# Patient Record
Sex: Female | Born: 1950
Health system: Southern US, Community
[De-identification: ages and names within clinical notes are randomized; demographics above are authoritative.]

## PROBLEM LIST (undated history)

## (undated) DIAGNOSIS — R112 Nausea with vomiting, unspecified: Secondary | ICD-10-CM

## (undated) DIAGNOSIS — I10 Essential (primary) hypertension: Secondary | ICD-10-CM

## (undated) DIAGNOSIS — E042 Nontoxic multinodular goiter: Secondary | ICD-10-CM

## (undated) DIAGNOSIS — E7800 Pure hypercholesterolemia, unspecified: Secondary | ICD-10-CM

## (undated) DIAGNOSIS — T8859XA Other complications of anesthesia, initial encounter: Secondary | ICD-10-CM

## (undated) DIAGNOSIS — M1990 Unspecified osteoarthritis, unspecified site: Secondary | ICD-10-CM

## (undated) HISTORY — PX: EYE SURGERY: SHX253

## (undated) HISTORY — PX: TONSILLECTOMY: SUR1361

## (undated) HISTORY — PX: ABDOMINAL HYSTERECTOMY: SHX81

---

## 2001-09-30 ENCOUNTER — Emergency Department (HOSPITAL_COMMUNITY): Admission: EM | Admit: 2001-09-30 | Discharge: 2001-09-30 | Payer: Self-pay | Admitting: Emergency Medicine

## 2001-09-30 ENCOUNTER — Encounter: Payer: Self-pay | Admitting: Emergency Medicine

## 2002-03-04 ENCOUNTER — Ambulatory Visit (HOSPITAL_COMMUNITY): Admission: EM | Admit: 2002-03-04 | Discharge: 2002-03-04 | Payer: Self-pay | Admitting: Emergency Medicine

## 2002-03-04 ENCOUNTER — Encounter: Payer: Self-pay | Admitting: Emergency Medicine

## 2002-03-21 HISTORY — PX: ABDOMINAL SURGERY: SHX537

## 2002-12-24 ENCOUNTER — Encounter: Payer: Self-pay | Admitting: Emergency Medicine

## 2002-12-24 ENCOUNTER — Inpatient Hospital Stay (HOSPITAL_COMMUNITY): Admission: EM | Admit: 2002-12-24 | Discharge: 2002-12-31 | Payer: Self-pay | Admitting: Emergency Medicine

## 2002-12-25 ENCOUNTER — Encounter: Payer: Self-pay | Admitting: Internal Medicine

## 2002-12-26 ENCOUNTER — Encounter: Payer: Self-pay | Admitting: General Surgery

## 2002-12-26 ENCOUNTER — Encounter: Payer: Self-pay | Admitting: Surgery

## 2002-12-26 ENCOUNTER — Encounter (INDEPENDENT_AMBULATORY_CARE_PROVIDER_SITE_OTHER): Payer: Self-pay | Admitting: *Deleted

## 2003-08-15 ENCOUNTER — Ambulatory Visit (HOSPITAL_COMMUNITY): Admission: RE | Admit: 2003-08-15 | Discharge: 2003-08-15 | Payer: Self-pay | Admitting: General Surgery

## 2004-08-23 ENCOUNTER — Ambulatory Visit (HOSPITAL_COMMUNITY): Admission: RE | Admit: 2004-08-23 | Discharge: 2004-08-23 | Payer: Self-pay | Admitting: General Surgery

## 2005-05-26 ENCOUNTER — Ambulatory Visit: Payer: Self-pay | Admitting: Family Medicine

## 2005-05-31 ENCOUNTER — Ambulatory Visit (HOSPITAL_COMMUNITY): Admission: RE | Admit: 2005-05-31 | Discharge: 2005-05-31 | Payer: Self-pay | Admitting: Family Medicine

## 2005-06-10 ENCOUNTER — Ambulatory Visit: Payer: Self-pay | Admitting: Family Medicine

## 2005-10-18 ENCOUNTER — Ambulatory Visit (HOSPITAL_COMMUNITY): Admission: RE | Admit: 2005-10-18 | Discharge: 2005-10-18 | Payer: Self-pay | Admitting: General Surgery

## 2005-11-07 ENCOUNTER — Emergency Department (HOSPITAL_COMMUNITY): Admission: EM | Admit: 2005-11-07 | Discharge: 2005-11-07 | Payer: Self-pay | Admitting: Emergency Medicine

## 2008-03-17 ENCOUNTER — Emergency Department (HOSPITAL_COMMUNITY): Admission: EM | Admit: 2008-03-17 | Discharge: 2008-03-17 | Payer: Self-pay | Admitting: Family Medicine

## 2008-08-06 ENCOUNTER — Ambulatory Visit: Payer: Self-pay | Admitting: Family Medicine

## 2008-08-06 DIAGNOSIS — IMO0002 Reserved for concepts with insufficient information to code with codable children: Secondary | ICD-10-CM

## 2008-08-06 DIAGNOSIS — F329 Major depressive disorder, single episode, unspecified: Secondary | ICD-10-CM

## 2010-08-06 NOTE — Discharge Summary (Signed)
NAMEJANICIA, [name] NO.:  [id number]   MEDICAL RECORD NO.:  [id number]                   PATIENT TYPE:  INP   LOCATION:  5738                                 FACILITY:  [facility]   PHYSICIAN:  [name], M.D.            DATE OF BIRTH:  06-06-1950   DATE OF ADMISSION:  12/24/2002  DATE OF DISCHARGE:  12/31/2002                                 DISCHARGE SUMMARY   ADMISSION DIAGNOSIS:  Perforated diverticulitis.   DISCHARGE DIAGNOSES:  1. Perforated Merckle's diverticulum with peritonitis.  2. Hypokalemia.  3. Atelectasis.   CHIEF COMPLAINT:  Abdominal pain.   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  Evidentially, it was much worse at a meeting today, and she was doubled over  in pain.  She was febrile with a T-max of 101.6.  Was seen in the emergency  room where a CT scan of the abdomen showed perforated sigmoid diverticulum.  She had been on amoxicillin for one day for some dental work and thought  that she was having a rash earlier.   ALLERGIES:  MORPHINE.   MEDICATIONS ON ADMISSION:  Calcium and Pepcid.   PAST MEDICAL HISTORY:  1. Menopause.  2. GERD.  3. Seasonal allergies.  4. History of viral meningitis.   PAST SURGICAL HISTORY:  1. TAH.  2. Appendectomy in 1990.   SOCIAL HISTORY:  Single with two children.  No tobacco and occasional  alcohol.  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   FAMILY HISTORY:  Noncontributory.   REVIEW OF SYSTEMS:  No history of diverticulitis.   PHYSICAL EXAMINATION:  GENERAL:  She had just received pain medicines prior  to her examination and was a little drowsy.  VITAL SIGNS:  T-max 101.6, current temperature on examination 101.  Blood  pressure 128/74, pulse 90 to 100s.  Pulse oximetry 97%.  HEENT:   Pupils were reactive bilaterally with dry mouth.  Her face was  flushed.  NECK:  Supple.  CHEST:  Clear to auscultation bilaterally.  HEART:  Regular with normal S1 and S2 and no bruits.  ABDOMEN:  Round with slight distention and positive bowel sounds.  Soft and  tender in the lower abdomen, left greater than right.  No rebound or  guarding.  RECTAL:  Exam was deferred.  EXTREMITIES:  Skin was warm and dry with no edema of the extremities and 2+  dorsalis pedis pulses.  NEUROLOGIC:  Alert and oriented when stimulated but still a little bit  drowsy.  Behavior was appropriate.  No focal deficits observed.   LABORATORY DATA:  Significant data: Hemoglobin 13.1, white blood cells 10.1.  Basic metabolic profile was completely normal with normal LFTs.  Urinalysis  is okay.  Initial report of the CT scan showed sigmoid diverticulum in the abdomen  with contained perforation and extraluminal air.  (This was small, no  abscess.)   HOSPITAL COURSE:  [name] was admitted with the initial diagnosis of sigmoid  diverticulitis with contained perforation and free air.  She continued to  have fevers with abdominal distention.  Her potassium level initially  dropped on antibiotics.  She was treated with intravenous Zosyn and  ciprofloxacin.  Surgical consult was obtained.  Repeat CT scan of the  abdomen showed slight worsening of free air and inflammation within the  central pelvic mesentery with several thickened loops of small bowel  contiguous with the inflammatory changes.  There was question of whether she  had more of a perforated Merckle's diverticulum as opposed to sigmoid  diverticulitis.  [name] discussed surgery with [name], especially since  she seemed to have more localized symptoms, and the CT scan was worsening.  She was taken to the OR on December 26, 2002, for resection of the Meckel's  diverticulum.   She did well after surgery and defervesced appropriately.  Abdominal exam   became much better.  She was able to get out of bed to chair and ambulate.  There was a question whether or not she had atelectasis during  hospitalization with low oxygen saturation, but continued use of incentive  spirometry and ambulation corrected this.  She was afebrile at the time of  discharge, mobile, with clean dressings as well.   CONDITION ON DISCHARGE:  Stable and improved.   DISCHARGE MEDICATIONS:  1. Ciprofloxacin 500 mg daily x 3 days.  2. Flagyl 500 mg b.i.d. x 3 days.  3. Restart calcium.  4. Percocet given for pain.   DIET:  Regular.   DISCHARGE INSTRUCTIONS:  She was to go home with wound care by [facility] on the day following admission.  She was to call for increased pain,  fevers, chills, etc.  She was to follow up with [name] within one week of  discharge.   CONSULTATIONS:  Surgery, [name], M.D.   ADDENDUM:  She also had received Flagyl during the admission.                                                [name], M.D.    SEJ/MEDQ  D:  02/15/2003  T:  02/15/2003  Job:  [id number]   cc:   [name], M.D.  [address], [address]  [place]  [place] [address]  Fax: [phone]

## 2010-08-06 NOTE — Discharge Summary (Signed)
NAMEAVORY, RAHIMI NO.:  [id number]   MEDICAL RECORD NO.:  [id number]                   PATIENT TYPE:  INP   LOCATION:  5738                                 FACILITY:  [facility]   PHYSICIAN:  Jimmye Norman III, M.D.               DATE OF BIRTH:  1951-02-26   DATE OF ADMISSION:  12/24/2002  DATE OF DISCHARGE:  12/31/2002                                 DISCHARGE SUMMARY   DISCHARGE DIAGNOSES:  Ruptured Meckel's diverticulum.   PRINCIPAL PROCEDURE:  Exploration, resection, repair of ruptured Meckel's  diverticulum.   SURGEON:  Jimmye Norman, M.D.   DISCHARGE MEDICATIONS:  Patient is sent home with Percocet for pain.  Patient is also sent home on Cipro and Flagyl which was to be continued for  another three days.   WOUND CARE:  Patient has [facility] for wound care.   DIET:  Patient is on a nonrestricted diet.   FOLLOW UP:  Patient is to see Dr. Lindie Spruce in one week.   HOSPITAL COURSE:  Briefly, the patient was admitted by the medicine service  on December 24, 2002 where surgical consultation was obtained.  Initial CT  scan demonstrated what was thought to be sigmoid diverticulitis and attempt  was made to calm her down with intravenous antibiotics.  Her pain became  worse over the next two days and on the third day of her hospitalization she  was taken to surgery at which time she was found to have a perforated  Meckel's diverticulum.  This was after a repeat CT scan was more suggestive  of a small bowel or Meckel's perforation.  She did well, although we did  have to leave her wound open because of the contamination from the  perforated viscus.  On postoperative day #4 she was tolerating a diet well,  had bowel sounds.  On postoperative day #5 we advanced her diet. [facility] was to see the patient and I was able to let he go home.  She is  to return to see me in one week.                                                Kathrin Ruddy, M.D.    JW/MEDQ  D:  03/06/2003  T:  03/06/2003  Job:  [id number]

## 2010-08-06 NOTE — Op Note (Signed)
NAMERUE, VALLADARES NO.:  [id number]   MEDICAL RECORD NO.:  [id number]                   PATIENT TYPE:  INP   LOCATION:  5738                                 FACILITY:  [facility]   PHYSICIAN:  Jimmye Norman III, M.D.               DATE OF BIRTH:  1950-04-04   DATE OF PROCEDURE:  12/26/2002  DATE OF DISCHARGE:                                 OPERATIVE REPORT   PREOPERATIVE DIAGNOSES:  Perforated viscus, likely perforated sigmoid  diverticulum, with diverticulitis and localized abscess.   POSTOPERATIVE DIAGNOSES:  Perforated Meckel's diverticulum with localized  intramesenteric abscess and peritonitis.   PROCEDURE:  Resection of Meckel's diverticulum.   SURGEON:  Jimmye Norman, M.D.   ASSISTANT:  Velora Heckler, M.D.   ANESTHESIA:  General endotracheal.   ESTIMATED BLOOD LOSS:  50 mL.   COMPLICATIONS:  None.   CONDITION:  Stable.   SPECIMENS:  Perforated Meckel's diverticulum.   FLUIDS REPLACED:  2 L of crystalloid.   INDICATION FOR OPERATION:  The patient is a 60 year old who had the acute  onset of abdominal pain two days ago with CT scan demonstrating a pelvic  inflammatory mass or phlegmon with some perforated or free air in the  localized mesenteric area, who now on recent scan was found to have a likely  perforated small bowel and a possible Meckel's diverticulum.   OPERATION:  The patient was taken to the operating room and placed on the  table in supine position.  After an adequate endotracheal anesthetic was  administered, she was prepped and draped in the usual sterile manner  exposing the midline.   A midline incision was taken from just to the right of the umbilicus down  to, through the midline, to the suprapubic crest.  It was taken down through  the midline fascia using electrocautery.  We were able to get into the  peritoneal cavity using Metzenbaum scissors and once we did, we opened the  incision inferiorly and superiorly  for the full length of the skin incision.   Once we were in the peritoneal cavity, cloudy peritoneal fluid was found in  the pelvic portion of the peritoneal cavity.  There was an inflammatory mass  in the deep portion of the pelvis attached posteriorly and inferiorly.  It  was attached to multiple appendices epiploica, and once we were able to free  them we showed that there was a perforated diverticulum of the Meckel's type  of small bowel.  This was approximately 20 cm from the ileocecal valve.   This was a localized perforation.  There was no evidence of any  subdiaphragmatic or other process.   We resected the Meckel's diverticulum up to viable bowel using a GIA-55  stapler and did the anastomosis using the same type of stapler.  The  resulting enterotomy was closed using a TA-55 stapler.  We then closed  the  mesentery using figure-of-eight stitches of 3-0 silk.  We secured the suture  line also with a 3-0 silk suture.  Once this was done we ran the small bowel  from the ligament of Treitz down to the terminal ileum, found there to be no  other areas of perforation; however, there were some loculated pockets of  pus from the perforation.  We irrigated with about 4 L of warm saline  solution.  The subdiaphragmatic areas were free of fluid collections.  This  was on both sides.  The colon from the cecum to the transverse colon and the  descending colon were free of any inflammatory process, and no palpable  masses.  The gallbladder was somewhat tense but had no evidence of stones.   Once we had completed the exploration and the resection, we closed the  abdomen.  The fascia was closed using a running #1 PDS suture.  Once this  was done the skin and subcu was irrigated with saline solution and then it  was partially closed with stainless steel staples, packing in between the  staples with a wet-to-dry 4 x 4 gauze.  A sterile dressing was applied on  top of that.  All needle counts,  sponge counts, and instrument counts were  correct.                                               Kathrin Ruddy, M.D.    JW/MEDQ  D:  12/26/2002  T:  12/27/2002  Job:  [id number]   cc:   Lilla Shook, M.D.  [address], [address]  [place]  [place]  [address]  Fax: [phone]

## 2010-12-23 LAB — POCT CARDIAC MARKERS
CKMB, poc: 1.2 ng/mL (ref 1.0–8.0)
Myoglobin, poc: 99.4 ng/mL (ref 12–200)
Troponin i, poc: <0.05 ng/mL (ref 0.00–0.09)

## 2010-12-23 LAB — POCT I-STAT, CHEM 8
BUN: 10 mg/dL (ref 6–23)
Calcium, Ion: 1.16 mmol/L (ref 1.12–1.32)
Chloride: 107 mEq/L (ref 96–112)
Creatinine, Ser: 1.1 mg/dL (ref 0.4–1.2)
HCT: 42 % (ref 36.0–46.0)
Hemoglobin: 14.3 g/dL (ref 12.0–15.0)
Sodium: 141 mEq/L (ref 135–145)

## 2011-04-19 ENCOUNTER — Other Ambulatory Visit: Payer: Self-pay | Admitting: Family Medicine

## 2018-07-17 ENCOUNTER — Encounter: Payer: Self-pay | Admitting: *Deleted

## 2018-08-01 DIAGNOSIS — Z8659 Personal history of other mental and behavioral disorders: Secondary | ICD-10-CM | POA: Diagnosis not present

## 2018-08-01 DIAGNOSIS — Z0000 Encounter for general adult medical examination without abnormal findings: Secondary | ICD-10-CM | POA: Diagnosis not present

## 2018-08-01 DIAGNOSIS — R030 Elevated blood-pressure reading, without diagnosis of hypertension: Secondary | ICD-10-CM | POA: Diagnosis not present

## 2018-08-09 ENCOUNTER — Other Ambulatory Visit: Payer: Self-pay | Admitting: *Deleted

## 2018-08-09 NOTE — Patient Outreach (Signed)
[facility]) Care Management  08/09/2018  KILEIGH ORTMANN 1950/09/01 [id number]   HTA HRA Telephone Screen     Outreach Attempt:  Multiple unsuccessful outreach attempts to patient in the month of April to complete Health Risk Assessment Screening.  HIPAA compliant voice messages left.   Plan:   RN Health Coach has sent Unsuccessful Letter.  RN Health Coach will make another outreach attempt to patient within the month of June.  [place] [phone] [email or web address] [email or web address]

## 2018-08-16 ENCOUNTER — Other Ambulatory Visit: Payer: Self-pay | Admitting: *Deleted

## 2018-08-22 DIAGNOSIS — F325 Major depressive disorder, single episode, in full remission: Secondary | ICD-10-CM | POA: Diagnosis not present

## 2018-08-22 DIAGNOSIS — I129 Hypertensive chronic kidney disease with stage 1 through stage 4 chronic kidney disease, or unspecified chronic kidney disease: Secondary | ICD-10-CM | POA: Diagnosis not present

## 2018-08-22 DIAGNOSIS — N183 Chronic kidney disease, stage 3 (moderate): Secondary | ICD-10-CM | POA: Diagnosis not present

## 2018-08-27 DIAGNOSIS — I129 Hypertensive chronic kidney disease with stage 1 through stage 4 chronic kidney disease, or unspecified chronic kidney disease: Secondary | ICD-10-CM | POA: Diagnosis not present

## 2018-08-31 DIAGNOSIS — I129 Hypertensive chronic kidney disease with stage 1 through stage 4 chronic kidney disease, or unspecified chronic kidney disease: Secondary | ICD-10-CM | POA: Diagnosis not present

## 2018-09-11 ENCOUNTER — Other Ambulatory Visit: Payer: Self-pay | Admitting: *Deleted

## 2018-09-11 NOTE — Patient Outreach (Addendum)
[place] [facility]) Care Management  09/11/2018  [name] [birth year] [id number]   Case Closure/Transition to [facility]   Outreach:  Patient case has been transitioned to [facility] for further Care Management assistance.  Plan: RN Health Coach will close case at this time. RN Health Coach unable to send primary care provider Care Management Case Closure Letter due to no PCP listed in the system. RN Health Coach will make patient inactive with [facility] Care Management at this time.  [place] [phone] [email or web address]

## 2018-09-19 DIAGNOSIS — N183 Chronic kidney disease, stage 3 (moderate): Secondary | ICD-10-CM | POA: Diagnosis not present

## 2018-09-19 DIAGNOSIS — I129 Hypertensive chronic kidney disease with stage 1 through stage 4 chronic kidney disease, or unspecified chronic kidney disease: Secondary | ICD-10-CM | POA: Diagnosis not present

## 2018-09-19 DIAGNOSIS — F325 Major depressive disorder, single episode, in full remission: Secondary | ICD-10-CM | POA: Diagnosis not present

## 2018-09-25 DIAGNOSIS — D1721 Benign lipomatous neoplasm of skin and subcutaneous tissue of right arm: Secondary | ICD-10-CM | POA: Diagnosis not present

## 2018-09-25 DIAGNOSIS — L570 Actinic keratosis: Secondary | ICD-10-CM | POA: Diagnosis not present

## 2018-09-25 DIAGNOSIS — L820 Inflamed seborrheic keratosis: Secondary | ICD-10-CM | POA: Diagnosis not present

## 2019-03-11 DIAGNOSIS — F325 Major depressive disorder, single episode, in full remission: Secondary | ICD-10-CM | POA: Diagnosis not present

## 2019-03-11 DIAGNOSIS — I129 Hypertensive chronic kidney disease with stage 1 through stage 4 chronic kidney disease, or unspecified chronic kidney disease: Secondary | ICD-10-CM | POA: Diagnosis not present

## 2019-03-11 DIAGNOSIS — R519 Headache, unspecified: Secondary | ICD-10-CM | POA: Diagnosis not present

## 2019-03-11 DIAGNOSIS — E7800 Pure hypercholesterolemia, unspecified: Secondary | ICD-10-CM | POA: Diagnosis not present

## 2019-03-11 DIAGNOSIS — N183 Chronic kidney disease, stage 3 unspecified: Secondary | ICD-10-CM | POA: Diagnosis not present

## 2019-04-29 ENCOUNTER — Ambulatory Visit: Payer: PPO

## 2019-05-02 DIAGNOSIS — I129 Hypertensive chronic kidney disease with stage 1 through stage 4 chronic kidney disease, or unspecified chronic kidney disease: Secondary | ICD-10-CM | POA: Diagnosis not present

## 2019-05-02 DIAGNOSIS — I6523 Occlusion and stenosis of bilateral carotid arteries: Secondary | ICD-10-CM | POA: Diagnosis not present

## 2019-05-20 ENCOUNTER — Other Ambulatory Visit: Payer: Self-pay

## 2019-05-20 ENCOUNTER — Emergency Department
Admission: EM | Admit: 2019-05-20 | Discharge: 2019-05-20 | Disposition: A | Discharge status: Home / Self Care | Payer: PPO | Attending: Emergency Medicine | Admitting: Emergency Medicine

## 2019-05-20 ENCOUNTER — Encounter: Payer: Self-pay | Admitting: Emergency Medicine

## 2019-05-20 ENCOUNTER — Emergency Department: Payer: PPO

## 2019-05-20 DIAGNOSIS — Y929 Unspecified place or not applicable: Secondary | ICD-10-CM | POA: Diagnosis not present

## 2019-05-20 DIAGNOSIS — W0110XA Fall on same level from slipping, tripping and stumbling with subsequent striking against unspecified object, initial encounter: Secondary | ICD-10-CM | POA: Diagnosis not present

## 2019-05-20 DIAGNOSIS — I10 Essential (primary) hypertension: Secondary | ICD-10-CM | POA: Diagnosis not present

## 2019-05-20 DIAGNOSIS — Y939 Activity, unspecified: Secondary | ICD-10-CM | POA: Insufficient documentation

## 2019-05-20 DIAGNOSIS — S0990XA Unspecified injury of head, initial encounter: Secondary | ICD-10-CM | POA: Insufficient documentation

## 2019-05-20 DIAGNOSIS — Z79899 Other long term (current) drug therapy: Secondary | ICD-10-CM | POA: Insufficient documentation

## 2019-05-20 DIAGNOSIS — R55 Syncope and collapse: Secondary | ICD-10-CM | POA: Diagnosis not present

## 2019-05-20 DIAGNOSIS — Y999 Unspecified external cause status: Secondary | ICD-10-CM | POA: Insufficient documentation

## 2019-05-20 HISTORY — DX: Pure hypercholesterolemia, unspecified: E78.00

## 2019-05-20 HISTORY — DX: Essential (primary) hypertension: I10

## 2019-05-20 LAB — BASIC METABOLIC PANEL
Anion gap: 10 (ref 5–15)
BUN: 12 mg/dL (ref 8–23)
CO2: 25 mmol/L (ref 22–32)
Calcium: 8.9 mg/dL (ref 8.9–10.3)
Chloride: 103 mmol/L (ref 98–111)
Creatinine, Ser: 1.26 mg/dL — ABNORMAL HIGH (ref 0.44–1.00)
GFR calc Af Amer: 50 mL/min — ABNORMAL LOW (ref >60)
GFR calc non Af Amer: 43 mL/min — ABNORMAL LOW (ref >60)
Glucose, Bld: 145 mg/dL — ABNORMAL HIGH (ref 70–99)
Potassium: 4.3 mmol/L (ref 3.5–5.1)
Sodium: 138 mmol/L (ref 135–145)

## 2019-05-20 LAB — URINALYSIS, COMPLETE (UACMP) WITH MICROSCOPIC
Bilirubin Urine: NEGATIVE
Glucose, UA: NEGATIVE mg/dL
Hgb urine dipstick: NEGATIVE
Ketones, ur: NEGATIVE mg/dL
Leukocytes,Ua: NEGATIVE
Nitrite: NEGATIVE
Protein, ur: NEGATIVE mg/dL
Specific Gravity, Urine: 1.006 (ref 1.005–1.030)
pH: 6 (ref 5.0–8.0)

## 2019-05-20 LAB — CBC
HCT: 42.6 % (ref 36.0–46.0)
Hemoglobin: 14 g/dL (ref 12.0–15.0)
MCH: 29.7 pg (ref 26.0–34.0)
MCHC: 32.9 g/dL (ref 30.0–36.0)
MCV: 90.3 fL (ref 80.0–100.0)
Platelets: 220 10*3/uL (ref 150–400)
RBC: 4.72 MIL/uL (ref 3.87–5.11)
RDW: 12.1 % (ref 11.5–15.5)
WBC: 5.6 10*3/uL (ref 4.0–10.5)
nRBC: 0 % (ref 0.0–0.2)

## 2019-05-20 MED ORDER — SODIUM CHLORIDE 0.9% FLUSH: 3.0000 mL | Freq: Once | INTRAVENOUS | Status: DC

## 2019-05-20 NOTE — ED Provider Notes (Signed)
[facility] Emergency Department Provider Note ____________________________________________   First MD Initiated Contact with Patient 05/20/19 1541     (approximate)  I have reviewed the triage vital signs and the nursing notes.   HISTORY  Chief Complaint Loss of Consciousness    HPI [name] is a 69 y.o. female with PMH as noted below who presents with a head injury and syncope acute onset this morning.  The patient states that she had her second COVID-19 vaccination 2 days ago, and yesterday was feeling weak and ill with chills and was not eating or drinking.  The patient states that today after she got up this morning she felt lightheaded, lost consciousness, and then found herself on the floor.  She sustained a laceration of the back of her head which initially was bleeding a lot but has now stopped.  She denies any other injuries.  She states that she now feels completely back to her baseline.  Past Medical History:  Diagnosis Date  . High cholesterol   . Hypertension     Patient Active Problem List   Diagnosis Date Noted  . DEPRESSION 08/06/2008  . BACK PAIN, LUMBAR, WITH RADICULOPATHY 08/06/2008    Past Surgical History:  Procedure Laterality Date  . ABDOMINAL HYSTERECTOMY    . ABDOMINAL SURGERY      Prior to Admission medications   Medication Sig Start Date End Date Taking? Authorizing Provider  Calcium Citrate-Vitamin D 500-400 MG-UNIT CHEW One by mouth twice daily.     [provider]  cyclobenzaprine (FLEXERIL) 5 MG tablet Take one or two by mouth 3 times a day as needed for back spasm.     [provider]  FLUoxetine (PROZAC) 10 MG capsule Take 10 mg by mouth every morning.      [provider]  predniSONE (DELTASONE) 20 MG tablet Take 20 mg by mouth 2 (two) times daily. For 5 days.     [provider]    Allergies Morphine  No family history on file.  Social History Social History    Tobacco Use  . Smoking status: Never Smoker  . Smokeless tobacco: Never Used  Substance Use Topics  . Alcohol use: Not on file  . Drug use: Not on file    Review of Systems  Constitutional: No fever. Eyes: No redness. ENT: No sore throat. Cardiovascular: Denies chest pain. Respiratory: Denies shortness of breath. Gastrointestinal: No vomiting or diarrhea.  Genitourinary: Negative for flank pain.  Musculoskeletal: Negative for back pain. Skin: Negative for rash. Neurological: Negative for headaches, focal weakness or numbness.   ____________________________________________   PHYSICAL EXAM:  VITAL SIGNS: ED Triage Vitals  Enc Vitals Group     BP 05/20/19 1337 (!) 158/80     Pulse Rate 05/20/19 1337 91     Resp 05/20/19 1337 16     Temp 05/20/19 1337 98.3 F (36.8 C)     Temp Source 05/20/19 1337 Oral     SpO2 05/20/19 1337 98 %     Weight 05/20/19 1338 178 lb (80.7 kg)     Height 05/20/19 1338 5\' 6"  (1.676 m)     Head Circumference --      Peak Flow --      Pain Score 05/20/19 1338 2     Pain Loc --      Pain Edu? --      Excl. in [place]? --     Constitutional: Alert and oriented. Well appearing and  in no acute distress. Eyes: Conjunctivae are normal.  EOMI.  PERRLA. Head: 1.5 cm superficial laceration to the left occipital area which appears closed.  No bleeding. Nose: No congestion/rhinnorhea. Mouth/Throat: Mucous membranes are moist.   Neck: Normal range of motion.  No midline cervical spinal tenderness. Cardiovascular: Good peripheral circulation. Respiratory: Normal respiratory effort.  No retractions. Gastrointestinal: No distention.  Musculoskeletal:  Extremities warm and well perfused.  Neurologic:  Normal speech and language.  Motor and sensory intact in all extremities.  Normal coordination.   Skin:  Skin is warm and dry. No rash noted. Psychiatric: Mood and affect are normal. Speech and behavior are  normal.  ____________________________________________   LABS (all labs ordered are listed, but only abnormal results are displayed)  Labs Reviewed  BASIC METABOLIC PANEL - Abnormal; Notable for the following components:      Result Value   Glucose, Bld 145 (*)    Creatinine, Ser 1.26 (*)    GFR calc non Af Amer 43 (*)    GFR calc Af Amer 50 (*)    All other components within normal limits  URINALYSIS, COMPLETE (UACMP) WITH MICROSCOPIC - Abnormal; Notable for the following components:   Color, Urine YELLOW (*)    APPearance CLEAR (*)    Bacteria, UA RARE (*)    All other components within normal limits  CBC  CBG MONITORING, ED   ____________________________________________  EKG  ED ECG REPORT I, Arta Silence, the attending physician, personally viewed and interpreted this ECG.  Date: 05/20/2019 EKG Time: 1339 Rate: 85 Rhythm: normal sinus rhythm QRS Axis: normal Intervals: normal ST/T Wave abnormalities: normal Narrative Interpretation: no evidence of acute ischemia  ____________________________________________  RADIOLOGY  CT head: No ICH or other acute abnormality  ____________________________________________   PROCEDURES  Procedure(s) performed: No  Procedures  Critical Care performed: No ____________________________________________   INITIAL IMPRESSION / ASSESSMENT AND PLAN / ED COURSE  Pertinent labs & imaging results that were available during my care of the patient were reviewed by me and considered in my medical decision making (see chart for details).  69 year old female with PMH as noted above presents with syncope and a head injury earlier today.  The patient states that she felt weak with chills and fever yesterday after getting her second dose of the COVID-19 vaccination the day before.  She had not really been eating or drinking much.  Today after getting up she felt dizzy and then lost consciousness.  On exam, the patient is  well-appearing and her vital signs are normal except for mild hypertension.  Neurologic exam is nonfocal.  She has a small laceration to the back of her head which appears to have already closed on its own and does not require repair.  There is no midline spinal tenderness.  Lab work-up was obtained from triage and is within normal limits except for minimally elevated creatinine from baseline.  The patient is currently completely asymptomatic.  Overall I suspect vasovagal or hypovolemia/orthostatic related syncope probably from being dehydrated from not eating or drinking the day prior.  Given the reassuring lab work-up and vital signs, there is no indication for IV fluids at this time.  There are no signs or symptoms to suggest cardiac etiology, the patient's EKG is normal.  Because of the patient's age and the fact that she is on aspirin, I recommended a CT of the head.  If this is negative anticipate discharge home.  ----------------------------------------- 5:06 PM on 05/20/2019 -----------------------------------------  CT head is  negative.  The patient is stable for discharge home.  Return precautions given, and she expresses understanding.  ____________________________________________   FINAL CLINICAL IMPRESSION(S) / ED DIAGNOSES  Final diagnoses:  Syncope, unspecified syncope type      NEW MEDICATIONS STARTED DURING THIS VISIT:  New Prescriptions   No medications on file     Note:  This document was prepared using Dragon voice recognition software and may include unintentional dictation errors.   Arta Silence, MD 05/20/19 [phone]

## 2019-05-20 NOTE — Discharge Instructions (Addendum)
Return to the ER for new, worsening, or recurrent lightheadedness or feeling like you are going to pass out, recurrent episodes of passing out, severe headache, vomiting, weakness or any other new or worsening symptoms that concern you.  Make sure you are drinking fluids regularly.  Follow-up with your primary care doctor.

## 2019-05-20 NOTE — ED Notes (Signed)
First Nurse Note:  Pt brought over from [facility]; brought over due to syncopal episode this morning.  Pt also with laceration to posterior head, bleeding controlled at this time.  Pt A/Ox4 upon arrival, ambulatory without difficulty.  NAD noted at this time.

## 2019-05-20 NOTE — ED Notes (Signed)
Pt given paper copy of discharge instructions to sign due to computer issue.

## 2019-05-20 NOTE — ED Triage Notes (Signed)
Had syncopal event this morning, fell and hit head.  States had second covid shot on Saturday and felt bad yesterday.  States had run a fever and did not eat or drink much yesterday.  Arrives today, AAOx3.  Skin warm and dry. NAD.  Small laceration to back of head.  Bleeding controlled.

## 2019-06-10 DIAGNOSIS — F325 Major depressive disorder, single episode, in full remission: Secondary | ICD-10-CM | POA: Diagnosis not present

## 2019-06-10 DIAGNOSIS — I129 Hypertensive chronic kidney disease with stage 1 through stage 4 chronic kidney disease, or unspecified chronic kidney disease: Secondary | ICD-10-CM | POA: Diagnosis not present

## 2019-06-10 DIAGNOSIS — I779 Disorder of arteries and arterioles, unspecified: Secondary | ICD-10-CM | POA: Diagnosis not present

## 2019-06-10 DIAGNOSIS — N183 Chronic kidney disease, stage 3 unspecified: Secondary | ICD-10-CM | POA: Diagnosis not present

## 2019-06-14 DIAGNOSIS — E041 Nontoxic single thyroid nodule: Secondary | ICD-10-CM | POA: Diagnosis not present

## 2019-07-31 DIAGNOSIS — E042 Nontoxic multinodular goiter: Secondary | ICD-10-CM | POA: Diagnosis not present

## 2019-08-21 DIAGNOSIS — E042 Nontoxic multinodular goiter: Secondary | ICD-10-CM | POA: Diagnosis not present

## 2019-10-08 DIAGNOSIS — I10 Essential (primary) hypertension: Secondary | ICD-10-CM | POA: Diagnosis not present

## 2019-10-08 DIAGNOSIS — Z885 Allergy status to narcotic agent status: Secondary | ICD-10-CM | POA: Diagnosis not present

## 2019-10-08 DIAGNOSIS — S93601A Unspecified sprain of right foot, initial encounter: Secondary | ICD-10-CM | POA: Diagnosis not present

## 2019-10-08 DIAGNOSIS — E785 Hyperlipidemia, unspecified: Secondary | ICD-10-CM | POA: Diagnosis not present

## 2019-10-08 DIAGNOSIS — S99921A Unspecified injury of right foot, initial encounter: Secondary | ICD-10-CM | POA: Diagnosis not present

## 2019-11-04 DIAGNOSIS — H52223 Regular astigmatism, bilateral: Secondary | ICD-10-CM | POA: Diagnosis not present

## 2019-11-04 DIAGNOSIS — Z9842 Cataract extraction status, left eye: Secondary | ICD-10-CM | POA: Diagnosis not present

## 2019-11-04 DIAGNOSIS — Z9841 Cataract extraction status, right eye: Secondary | ICD-10-CM | POA: Diagnosis not present

## 2019-11-04 DIAGNOSIS — H57813 Brow ptosis, bilateral: Secondary | ICD-10-CM | POA: Diagnosis not present

## 2020-02-11 DIAGNOSIS — M21612 Bunion of left foot: Secondary | ICD-10-CM | POA: Diagnosis not present

## 2020-02-11 DIAGNOSIS — I129 Hypertensive chronic kidney disease with stage 1 through stage 4 chronic kidney disease, or unspecified chronic kidney disease: Secondary | ICD-10-CM | POA: Diagnosis not present

## 2020-02-11 DIAGNOSIS — Z23 Encounter for immunization: Secondary | ICD-10-CM | POA: Diagnosis not present

## 2020-02-11 DIAGNOSIS — Z1231 Encounter for screening mammogram for malignant neoplasm of breast: Secondary | ICD-10-CM | POA: Diagnosis not present

## 2020-02-11 DIAGNOSIS — F325 Major depressive disorder, single episode, in full remission: Secondary | ICD-10-CM | POA: Diagnosis not present

## 2020-02-11 DIAGNOSIS — I779 Disorder of arteries and arterioles, unspecified: Secondary | ICD-10-CM | POA: Diagnosis not present

## 2020-02-11 DIAGNOSIS — Z1211 Encounter for screening for malignant neoplasm of colon: Secondary | ICD-10-CM | POA: Diagnosis not present

## 2020-02-11 DIAGNOSIS — N183 Chronic kidney disease, stage 3 unspecified: Secondary | ICD-10-CM | POA: Diagnosis not present

## 2020-02-11 DIAGNOSIS — Z0000 Encounter for general adult medical examination without abnormal findings: Secondary | ICD-10-CM | POA: Diagnosis not present

## 2020-03-10 DIAGNOSIS — M2011 Hallux valgus (acquired), right foot: Secondary | ICD-10-CM | POA: Diagnosis not present

## 2020-03-16 DIAGNOSIS — Z23 Encounter for immunization: Secondary | ICD-10-CM | POA: Diagnosis not present

## 2020-03-30 ENCOUNTER — Other Ambulatory Visit: Payer: PPO

## 2020-03-31 DIAGNOSIS — M2011 Hallux valgus (acquired), right foot: Secondary | ICD-10-CM | POA: Diagnosis not present

## 2020-04-01 ENCOUNTER — Other Ambulatory Visit: Payer: Self-pay | Admitting: Podiatry

## 2020-04-01 ENCOUNTER — Inpatient Hospital Stay: Admission: RE | Admit: 2020-04-01 | Payer: PPO | Source: Ambulatory Visit

## 2020-04-02 ENCOUNTER — Other Ambulatory Visit: Payer: Self-pay

## 2020-04-02 ENCOUNTER — Encounter
Admission: RE | Admit: 2020-04-02 | Discharge: 2020-04-02 | Disposition: A | Discharge status: Home / Self Care | Payer: PPO | Source: Ambulatory Visit | Attending: Podiatry | Admitting: Podiatry

## 2020-04-02 HISTORY — DX: Unspecified osteoarthritis, unspecified site: M19.90

## 2020-04-02 HISTORY — DX: Nausea with vomiting, unspecified: R11.2

## 2020-04-02 HISTORY — DX: Nontoxic multinodular goiter: E04.2

## 2020-04-02 HISTORY — DX: Other complications of anesthesia, initial encounter: T88.59XA

## 2020-04-02 NOTE — Patient Instructions (Addendum)
Your procedure is scheduled on:04-10-20 FRIDAY Report to the Registration Desk on the 1st floor of the [facility]-Then proceed to the 2nd floor Surgery desk in the [place] To find out your arrival time, please call [phone] between 1PM - 3PM on:04-09-20 THURSDAY  REMEMBER: Instructions that are not followed completely may result in serious medical risk, up to and including death; or upon the discretion of your surgeon and anesthesiologist your surgery may need to be rescheduled.  Do not eat food after midnight the night before surgery.  No gum chewing, lozengers or hard candies.  You may however, drink CLEAR liquids up to 2 hours before you are scheduled to arrive for your surgery. Do not drink anything within 2 hours of your scheduled arrival time.  Clear liquids include: - water  - apple juice without pulp - gatorade - black coffee or tea (Do NOT add milk or creamers to the coffee or tea) Do NOT drink anything that is not on this list  In addition, your doctor has ordered for you to drink the provided  Ensure Pre-Surgery Clear Carbohydrate Drink  Drinking this carbohydrate drink up to two hours before surgery helps to reduce insulin resistance and improve patient outcomes. Please complete drinking 2 hours prior to scheduled arrival time.  TAKE THESE MEDICATIONS THE MORNING OF SURGERY WITH A SIP OF WATER: -NONE  Follow recommendations from Cardiologist, Pulmonologist or PCP regarding stopping Aspirin, Coumadin, Plavix, Eliquis, Pradaxa, or Pletal-ASPIRIN WILL BE STOPPED ON 04-07-20 Tuesday AS INSTRUCTED BY DR Vickki Muff    One week prior to surgery: Stop Anti-inflammatories (NSAIDS) such as Advil, Aleve, Ibuprofen, Motrin, Naproxen, Naprosyn and Aspirin based products such as Excedrin, Goodys Powder, BC Powder-OK TO TAKE TYLENOL IF NEEDED  Stop ANY OVER THE COUNTER supplements until after surgery. (However, you may continue taking Calcium-Vitamin D and multivitamin up until the  day before surgery.)  No Alcohol for 24 hours before or after surgery.  No Smoking including e-cigarettes for 24 hours prior to surgery.  No chewable tobacco products for at least 6 hours prior to surgery.  No nicotine patches on the day of surgery.  Do not use any "recreational" drugs for at least a week prior to your surgery.  Please be advised that the combination of cocaine and anesthesia may have negative outcomes, up to and including death. If you test positive for cocaine, your surgery will be cancelled.  On the morning of surgery brush your teeth with toothpaste and water, you may rinse your mouth with mouthwash if you wish. Do not swallow any toothpaste or mouthwash.  Do not wear jewelry, make-up, hairpins, clips or nail polish.  Do not wear lotions, powders, or perfumes.   Do not shave body from the neck down 48 hours prior to surgery just in case you cut yourself which could leave a site for infection.  Also, freshly shaved skin may become irritated if using the CHG soap.  Contact lenses, hearing aids and dentures may not be worn into surgery.  Do not bring valuables to the hospital. [facility] is not responsible for any missing/lost belongings or valuables.   Use CHG wipes as directed on instruction sheet.  Total Shoulder Arthroplasty:  use Benzolyl Peroxide 5% Gel as directed on instruction sheet.  Fleets enema or Magnesium Citrate as directed.  Bring your C-PAP to the hospital with you in case you may have to spend the night.   Notify your doctor if there is any change in your medical  condition (cold, fever, infection).  Wear comfortable clothing (specific to your surgery type) to the hospital.  Plan for stool softeners for home use; pain medications have a tendency to cause constipation. You can also help prevent constipation by eating foods high in fiber such as fruits and vegetables and drinking plenty of fluids as your diet allows.  After surgery, you can  help prevent lung complications by doing breathing exercises.  Take deep breaths and cough every 1-2 hours. Your doctor may order a device called an Incentive Spirometer to help you take deep breaths. When coughing or sneezing, hold a pillow firmly against your incision with both hands. This is called "splinting." Doing this helps protect your incision. It also decreases belly discomfort.  If you are being admitted to the hospital overnight, leave your suitcase in the car. After surgery it may be brought to your room.  If you are being discharged the day of surgery, you will not be allowed to drive home. You will need a responsible adult (18 years or older) to drive you home and stay with you that night.   If you are taking public transportation, you will need to have a responsible adult (18 years or older) with you. Please confirm with your physician that it is acceptable to use public transportation.   Please call the [facility] at [phone] if you have any questions about these instructions.  Visitation Policy:  Patients undergoing a surgery or procedure may have one family member or support person with them as long as that person is not COVID-19 positive or experiencing its symptoms.  That person may remain in the waiting area during the procedure.  Inpatient Visitation:    Visiting hours are 7 a.m. to 8 p.m. Patients will be allowed one visitor. The visitor may change daily. The visitor must pass COVID-19 screenings, use hand sanitizer when entering and exiting the patient's room and wear a mask at all times, including in the patient's room. Patients must also wear a mask when staff or their visitor are in the room. Masking is required regardless of vaccination status. Systemwide, no visitors 17 or younger.

## 2020-04-03 ENCOUNTER — Encounter
Admission: RE | Admit: 2020-04-03 | Discharge: 2020-04-03 | Disposition: A | Discharge status: Home / Self Care | Payer: PPO | Source: Ambulatory Visit | Attending: Podiatry | Admitting: Podiatry

## 2020-04-03 DIAGNOSIS — I10 Essential (primary) hypertension: Secondary | ICD-10-CM | POA: Insufficient documentation

## 2020-04-03 DIAGNOSIS — Z01812 Encounter for preprocedural laboratory examination: Secondary | ICD-10-CM | POA: Insufficient documentation

## 2020-04-03 DIAGNOSIS — I440 Atrioventricular block, first degree: Secondary | ICD-10-CM | POA: Insufficient documentation

## 2020-04-03 LAB — POTASSIUM: Potassium: 4.4 mmol/L (ref 3.5–5.1)

## 2020-04-08 ENCOUNTER — Other Ambulatory Visit: Payer: Self-pay

## 2020-04-08 ENCOUNTER — Other Ambulatory Visit
Admission: RE | Admit: 2020-04-08 | Discharge: 2020-04-08 | Disposition: A | Discharge status: Home / Self Care | Payer: PPO | Source: Ambulatory Visit | Attending: Podiatry | Admitting: Podiatry

## 2020-04-08 DIAGNOSIS — Z01812 Encounter for preprocedural laboratory examination: Secondary | ICD-10-CM | POA: Diagnosis not present

## 2020-04-08 DIAGNOSIS — Z20822 Contact with and (suspected) exposure to covid-19: Secondary | ICD-10-CM | POA: Insufficient documentation

## 2020-04-08 LAB — SARS CORONAVIRUS 2 (TAT 6-24 HRS): SARS Coronavirus 2: NEGATIVE

## 2020-04-10 ENCOUNTER — Ambulatory Visit
Admission: RE | Admit: 2020-04-10 | Discharge: 2020-04-10 | Disposition: A | Discharge status: Home / Self Care | Payer: PPO | Attending: Podiatry | Admitting: Podiatry

## 2020-04-10 ENCOUNTER — Encounter: Payer: Self-pay | Admitting: Podiatry

## 2020-04-10 ENCOUNTER — Encounter
Admission: RE | Disposition: A | Discharge status: Home / Self Care | Payer: Self-pay | Source: Home / Self Care | Attending: Podiatry

## 2020-04-10 ENCOUNTER — Ambulatory Visit: Payer: PPO | Admitting: Anesthesiology

## 2020-04-10 ENCOUNTER — Other Ambulatory Visit: Payer: Self-pay

## 2020-04-10 DIAGNOSIS — Z7982 Long term (current) use of aspirin: Secondary | ICD-10-CM | POA: Insufficient documentation

## 2020-04-10 DIAGNOSIS — M2011 Hallux valgus (acquired), right foot: Secondary | ICD-10-CM | POA: Insufficient documentation

## 2020-04-10 DIAGNOSIS — Z87891 Personal history of nicotine dependence: Secondary | ICD-10-CM | POA: Diagnosis not present

## 2020-04-10 DIAGNOSIS — Z885 Allergy status to narcotic agent status: Secondary | ICD-10-CM | POA: Insufficient documentation

## 2020-04-10 DIAGNOSIS — R531 Weakness: Secondary | ICD-10-CM | POA: Diagnosis not present

## 2020-04-10 DIAGNOSIS — Z888 Allergy status to other drugs, medicaments and biological substances status: Secondary | ICD-10-CM | POA: Insufficient documentation

## 2020-04-10 DIAGNOSIS — Z79899 Other long term (current) drug therapy: Secondary | ICD-10-CM | POA: Insufficient documentation

## 2020-04-10 HISTORY — PX: HALLUX VALGUS AUSTIN: SHX6623

## 2020-04-10 HISTORY — PX: HALLUX VALGUS AKIN: SHX6622

## 2020-04-10 SURGERY — CORRECTION, HALLUX VALGUS
Anesthesia: General | Site: Foot | Laterality: Right

## 2020-04-10 MED ORDER — FENTANYL CITRATE (PF) 100 MCG/2ML IJ SOLN
25.0000 ug | INTRAMUSCULAR | Status: DC | PRN
  Administered 2020-04-10: 25 ug via INTRAVENOUS

## 2020-04-10 MED ORDER — ONDANSETRON HCL 4 MG PO TABS: 4.0000 mg | ORAL_TABLET | Freq: Four times a day (QID) | ORAL | Status: DC | PRN

## 2020-04-10 MED ORDER — MIDAZOLAM HCL 2 MG/2ML IJ SOLN
INTRAMUSCULAR | Status: DC | PRN
  Administered 2020-04-10: 2 mg via INTRAVENOUS

## 2020-04-10 MED ORDER — ORAL CARE MOUTH RINSE: 15.0000 mL | Freq: Once | OROMUCOSAL | Status: AC

## 2020-04-10 MED ORDER — FAMOTIDINE 20 MG PO TABS
20.0000 mg | ORAL_TABLET | Freq: Once | ORAL | Status: AC
  Administered 2020-04-10: 20 mg via ORAL

## 2020-04-10 MED ORDER — ONDANSETRON HCL 4 MG/2ML IJ SOLN
INTRAMUSCULAR | Status: DC | PRN
  Administered 2020-04-10: 4 mg via INTRAVENOUS

## 2020-04-10 MED ORDER — DEXMEDETOMIDINE (PRECEDEX) IN NS 20 MCG/5ML (4 MCG/ML) IV SYRINGE
PREFILLED_SYRINGE | INTRAVENOUS | Status: DC | PRN
  Administered 2020-04-10: 20 ug via INTRAVENOUS

## 2020-04-10 MED ORDER — LIDOCAINE HCL (CARDIAC) PF 100 MG/5ML IV SOSY
PREFILLED_SYRINGE | INTRAVENOUS | Status: DC | PRN
  Administered 2020-04-10: 100 mg via INTRAVENOUS

## 2020-04-10 MED ORDER — OXYCODONE-ACETAMINOPHEN 5-325 MG PO TABS: 1.0000 | ORAL_TABLET | Freq: Once | ORAL | Status: AC

## 2020-04-10 MED ORDER — DEXAMETHASONE SODIUM PHOSPHATE 10 MG/ML IJ SOLN
INTRAMUSCULAR | Status: DC | PRN
  Administered 2020-04-10: 10 mg via INTRAVENOUS

## 2020-04-10 MED ORDER — BUPIVACAINE HCL (PF) 0.5 % IJ SOLN
INTRAMUSCULAR | Status: DC | PRN
  Administered 2020-04-10: 10 mL

## 2020-04-10 MED ORDER — PROPOFOL 10 MG/ML IV BOLUS
INTRAVENOUS | Status: AC
  Filled 2020-04-10: qty 20

## 2020-04-10 MED ORDER — ONDANSETRON HCL 4 MG/2ML IJ SOLN: 4.0000 mg | Freq: Four times a day (QID) | INTRAMUSCULAR | Status: DC | PRN

## 2020-04-10 MED ORDER — FENTANYL CITRATE (PF) 100 MCG/2ML IJ SOLN
INTRAMUSCULAR | Status: DC | PRN
  Administered 2020-04-10 (×5): 50 ug via INTRAVENOUS

## 2020-04-10 MED ORDER — EPHEDRINE SULFATE 50 MG/ML IJ SOLN
INTRAMUSCULAR | Status: DC | PRN
  Administered 2020-04-10: 20 mg via INTRAVENOUS

## 2020-04-10 MED ORDER — ONDANSETRON HCL 4 MG/2ML IJ SOLN: 4.0000 mg | Freq: Once | INTRAMUSCULAR | Status: DC | PRN

## 2020-04-10 MED ORDER — ONDANSETRON HCL 4 MG/2ML IJ SOLN
INTRAMUSCULAR | Status: AC
  Filled 2020-04-10: qty 2

## 2020-04-10 MED ORDER — FAMOTIDINE 20 MG PO TABS
ORAL_TABLET | ORAL | Status: AC
  Filled 2020-04-10: qty 1

## 2020-04-10 MED ORDER — EPHEDRINE 5 MG/ML INJ
INTRAVENOUS | Status: AC
  Filled 2020-04-10: qty 10

## 2020-04-10 MED ORDER — METOCLOPRAMIDE HCL 10 MG PO TABS: 5.0000 mg | ORAL_TABLET | Freq: Three times a day (TID) | ORAL | Status: DC | PRN

## 2020-04-10 MED ORDER — CEFAZOLIN SODIUM-DEXTROSE 2-4 GM/100ML-% IV SOLN
2.0000 g | INTRAVENOUS | Status: AC
  Administered 2020-04-10: 2 g via INTRAVENOUS

## 2020-04-10 MED ORDER — LACTATED RINGERS IV SOLN: INTRAVENOUS | Status: DC

## 2020-04-10 MED ORDER — OXYCODONE-ACETAMINOPHEN 5-325 MG PO TABS: 1.0000 | ORAL_TABLET | Freq: Four times a day (QID) | ORAL | 0 refills | Status: AC | PRN

## 2020-04-10 MED ORDER — OXYCODONE-ACETAMINOPHEN 5-325 MG PO TABS
ORAL_TABLET | ORAL | Status: AC
  Administered 2020-04-10: 1 via ORAL
  Filled 2020-04-10: qty 1

## 2020-04-10 MED ORDER — BUPIVACAINE LIPOSOME 1.3 % IJ SUSP
INTRAMUSCULAR | Status: DC | PRN
  Administered 2020-04-10: 10 mL

## 2020-04-10 MED ORDER — LIDOCAINE HCL (PF) 2 % IJ SOLN
INTRAMUSCULAR | Status: AC
  Filled 2020-04-10: qty 5

## 2020-04-10 MED ORDER — FENTANYL CITRATE (PF) 250 MCG/5ML IJ SOLN
INTRAMUSCULAR | Status: AC
  Filled 2020-04-10: qty 5

## 2020-04-10 MED ORDER — BUPIVACAINE HCL (PF) 0.5 % IJ SOLN
INTRAMUSCULAR | Status: AC
  Filled 2020-04-10: qty 30

## 2020-04-10 MED ORDER — FENTANYL CITRATE (PF) 100 MCG/2ML IJ SOLN
INTRAMUSCULAR | Status: AC
  Administered 2020-04-10: 25 ug via INTRAVENOUS
  Filled 2020-04-10: qty 2

## 2020-04-10 MED ORDER — CEFAZOLIN SODIUM-DEXTROSE 2-4 GM/100ML-% IV SOLN
INTRAVENOUS | Status: AC
  Filled 2020-04-10: qty 100

## 2020-04-10 MED ORDER — ACETAMINOPHEN 10 MG/ML IV SOLN
INTRAVENOUS | Status: AC
  Filled 2020-04-10: qty 100

## 2020-04-10 MED ORDER — CHLORHEXIDINE GLUCONATE 0.12 % MT SOLN
15.0000 mL | Freq: Once | OROMUCOSAL | Status: AC
  Administered 2020-04-10: 15 mL via OROMUCOSAL

## 2020-04-10 MED ORDER — POVIDONE-IODINE 7.5 % EX SOLN
Freq: Once | CUTANEOUS | Status: DC
  Filled 2020-04-10: qty 118

## 2020-04-10 MED ORDER — DEXAMETHASONE SODIUM PHOSPHATE 10 MG/ML IJ SOLN
INTRAMUSCULAR | Status: AC
  Filled 2020-04-10: qty 1

## 2020-04-10 MED ORDER — METOCLOPRAMIDE HCL 5 MG/ML IJ SOLN: 5.0000 mg | Freq: Three times a day (TID) | INTRAMUSCULAR | Status: DC | PRN

## 2020-04-10 MED ORDER — PROPOFOL 10 MG/ML IV BOLUS
INTRAVENOUS | Status: DC | PRN
  Administered 2020-04-10: 200 mg via INTRAVENOUS

## 2020-04-10 MED ORDER — BUPIVACAINE LIPOSOME 1.3 % IJ SUSP
INTRAMUSCULAR | Status: AC
  Filled 2020-04-10: qty 20

## 2020-04-10 MED ORDER — MIDAZOLAM HCL 2 MG/2ML IJ SOLN
INTRAMUSCULAR | Status: AC
  Filled 2020-04-10: qty 2

## 2020-04-10 MED ORDER — ACETAMINOPHEN 10 MG/ML IV SOLN
INTRAVENOUS | Status: DC | PRN
  Administered 2020-04-10: 1000 mg via INTRAVENOUS

## 2020-04-10 MED ORDER — CHLORHEXIDINE GLUCONATE 0.12 % MT SOLN
OROMUCOSAL | Status: AC
  Filled 2020-04-10: qty 15

## 2020-04-10 SURGICAL SUPPLY — 59 items
APL SKNCLS STERI-STRIP NONHPOA (GAUZE/BANDAGES/DRESSINGS) ×1
BENZOIN TINCTURE PRP APPL 2/3 (GAUZE/BANDAGES/DRESSINGS) ×2 IMPLANT
BIT DRILL 1.7 LNG CANN (DRILL) ×2 IMPLANT
BLADE MED AGGRESSIVE (BLADE) ×2 IMPLANT
BLADE OSC/SAGITTAL MD 5.5X18 (BLADE) IMPLANT
BLADE OSC/SAGITTAL MD 9X18.5 (BLADE) ×2 IMPLANT
BLADE OSCILLATING/SAGITTAL (BLADE)
BLADE SURG 15 STRL LF DISP TIS (BLADE) ×4 IMPLANT
BLADE SURG 15 STRL SS (BLADE) ×8
BLADE SURG MINI STRL (BLADE) ×4 IMPLANT
BLADE SW THK.38XMED LNG THN (BLADE) IMPLANT
BNDG CMPR STD VLCR NS LF 5.8X4 (GAUZE/BANDAGES/DRESSINGS) ×1
BNDG CONFORM 3 STRL LF (GAUZE/BANDAGES/DRESSINGS) ×2 IMPLANT
BNDG ELASTIC 4X5.8 VLCR NS LF (GAUZE/BANDAGES/DRESSINGS) ×2 IMPLANT
BNDG ELASTIC 4X5.8 VLCR STR LF (GAUZE/BANDAGES/DRESSINGS) ×2 IMPLANT
BNDG ESMARK 4X12 TAN STRL LF (GAUZE/BANDAGES/DRESSINGS) ×2 IMPLANT
BNDG GAUZE 4.5X4.1 6PLY STRL (MISCELLANEOUS) ×2 IMPLANT
BOOT STEPPER DURA MED (SOFTGOODS) ×2 IMPLANT
CANISTER SUCT 1200ML W/VALVE (MISCELLANEOUS) IMPLANT
CLIP FIXATION STAPLE 10X10X10 (Staple) ×2 IMPLANT
COUNTERSINK HEADLESS 2.5 (ORTHOPEDIC DISPOSABLE SUPPLIES) ×2
COVER WAND RF STERILE (DRAPES) IMPLANT
CUFF TOURN SGL QUICK 12 (TOURNIQUET CUFF) IMPLANT
CUFF TOURN SGL QUICK 18X4 (TOURNIQUET CUFF) ×4 IMPLANT
DRAPE FLUOR MINI C-ARM 54X84 (DRAPES) ×2 IMPLANT
DURAPREP 26ML APPLICATOR (WOUND CARE) ×2 IMPLANT
ELECT CAUTERY BLADE 6.4 (BLADE) ×2 IMPLANT
ELECT REM PT RETURN 9FT ADLT (ELECTROSURGICAL) ×2
ELECTRODE REM PT RTRN 9FT ADLT (ELECTROSURGICAL) ×1 IMPLANT
GAUZE XEROFORM 1X8 LF (GAUZE/BANDAGES/DRESSINGS) ×2 IMPLANT
GLOVE BIO SURGEON STRL SZ7.5 (GLOVE) ×2 IMPLANT
GLOVE INDICATOR 8.0 STRL GRN (GLOVE) ×2 IMPLANT
GOWN STRL REUS W/ TWL XL LVL3 (GOWN DISPOSABLE) ×2 IMPLANT
GOWN STRL REUS W/TWL XL LVL3 (GOWN DISPOSABLE) ×4
KIT PROCEDURE DRILL (DRILL) ×2 IMPLANT
KIT TURNOVER KIT A (KITS) ×2 IMPLANT
LABEL OR SOLS (LABEL) IMPLANT
MANIFOLD NEPTUNE II (INSTRUMENTS) ×2 IMPLANT
NEEDLE HYPO 25X1 1.5 SAFETY (NEEDLE) ×4 IMPLANT
NS IRRIG 500ML POUR BTL (IV SOLUTION) ×2 IMPLANT
PACK EXTREMITY ARMC (MISCELLANEOUS) ×2 IMPLANT
PENCIL ELECTRO HAND CTR (MISCELLANEOUS) ×2 IMPLANT
RASP SM TEAR CROSS CUT (RASP) ×2 IMPLANT
SCREW CANN HDLS ST 2.5X22 (Screw) ×2 IMPLANT
SCREW COUNTERSINK HEADLESS 2.5 (ORTHOPEDIC DISPOSABLE SUPPLIES) ×1 IMPLANT
SPLINT CAST 1 STEP 5X30 WHT (MISCELLANEOUS) IMPLANT
STOCKINETTE STRL 6IN 960660 (GAUZE/BANDAGES/DRESSINGS) ×2 IMPLANT
STRIP CLOSURE SKIN 1/4X4 (GAUZE/BANDAGES/DRESSINGS) ×2 IMPLANT
SUT ETHILON 5-0 FS-2 18 BLK (SUTURE) ×2 IMPLANT
SUT MON AB 5-0 P3 18 (SUTURE) ×2 IMPLANT
SUT VIC AB 3-0 SH 27 (SUTURE) ×2
SUT VIC AB 3-0 SH 27X BRD (SUTURE) ×1 IMPLANT
SUT VIC AB 4-0 FS2 27 (SUTURE) ×2 IMPLANT
SWABSTK COMLB BENZOIN TINCTURE (MISCELLANEOUS) ×2 IMPLANT
SYR 10ML LL (SYRINGE) ×4 IMPLANT
TAPE STRIPS DRAPE STRL (GAUZE/BANDAGES/DRESSINGS) ×2 IMPLANT
WIRE SMOOTH TROCAR .9MMX150MML (WIRE) ×4 IMPLANT
WIRE Z .045 C-WIRE SPADE TIP (WIRE) ×2 IMPLANT
WIRE Z .062 C-WIRE SPADE TIP (WIRE) ×2 IMPLANT

## 2020-04-10 NOTE — Anesthesia Procedure Notes (Signed)
Procedure Name: LMA Insertion °Performed by: Lynnlee Revels R, CRNA °Pre-anesthesia Checklist: Patient identified, Emergency Drugs available, Suction available and Patient being monitored °Patient Re-evaluated:Patient Re-evaluated prior to induction °Oxygen Delivery Method: Circle system utilized °Preoxygenation: Pre-oxygenation with 100% oxygen °Induction Type: IV induction °LMA: LMA inserted °LMA Size: 4.0 °Tube type: Oral °Number of attempts: 1 °Placement Confirmation: positive ETCO2 and breath sounds checked- equal and bilateral °Dental Injury: Teeth and Oropharynx as per pre-operative assessment  ° ° ° ° ° ° °

## 2020-04-10 NOTE — Anesthesia Preprocedure Evaluation (Addendum)
Anesthesia Evaluation  Patient identified by MRN, date of birth, ID band Patient awake    Reviewed: Allergy & Precautions, NPO status , Patient's Chart, lab work & pertinent test results  History of Anesthesia Complications (+) PONV and history of anesthetic complications  Airway Mallampati: III       Dental  (+) Dental Advisory Given, Caps Broken left lower molar bridge:   Pulmonary former smoker,    Pulmonary exam normal        Cardiovascular hypertension, Normal cardiovascular exam     Neuro/Psych PSYCHIATRIC DISORDERS Depression  Neuromuscular disease    GI/Hepatic negative GI ROS, Neg liver ROS,   Endo/Other  negative endocrine ROS  Renal/GU negative Renal ROS  negative genitourinary   Musculoskeletal  (+) Arthritis , Osteoarthritis,    Abdominal Normal abdominal exam  (+)   Peds negative pediatric ROS (+)  Hematology negative hematology ROS (+)   Anesthesia Other Findings Past Medical History: No date: Arthritis No date: Complication of anesthesia No date: High cholesterol No date: Hypertension No date: Multinodular thyroid No date: PONV (postoperative nausea and vomiting)     Comment:  mostly nausea  Reproductive/Obstetrics                            Anesthesia Physical Anesthesia Plan  ASA: II  Anesthesia Plan: General   Post-op Pain Management:    Induction: Intravenous  PONV Risk Score and Plan:   Airway Management Planned: LMA  Additional Equipment:   Intra-op Plan:   Post-operative Plan: Extubation in OR  Informed Consent: I have reviewed the patients History and Physical, chart, labs and discussed the procedure including the risks, benefits and alternatives for the proposed anesthesia with the patient or authorized representative who has indicated his/her understanding and acceptance.     Dental advisory given  Plan Discussed with: CRNA and  Surgeon  Anesthesia Plan Comments:         Anesthesia Quick Evaluation

## 2020-04-10 NOTE — Discharge Instructions (Signed)
[facility]  POST OPERATIVE INSTRUCTIONS FOR DR. TROXLER, DR. FOWLER, AND DR. [facility] PODIATRY DEPARTMENT   1. Take your medication as prescribed.  Pain medication should be taken only as needed.  2. Keep the dressing clean, dry and intact.  3. Keep your foot elevated above the heart level for the first 48 hours.  4. Walking to the bathroom and brief periods of walking are acceptable, unless we have instructed you to be non-weight bearing.  5. Always wear your post-op shoe when walking.  Always use your crutches if you are to be non-weight bearing.  6. Do not take a shower. Baths are permissible as long as the foot is kept out of the water.   7. Every hour you are awake:  - Bend your knee 15 times. - Flex foot 15 times - Massage calf 15 times  8. Call [facility] ([phone]) if any of the following problems occur: - You develop a temperature or fever. - The bandage becomes saturated with blood. - Medication does not stop your pain. - Injury of the foot occurs. - Any symptoms of infection including redness, odor, or red streaks running from wound.  AMBULATORY SURGERY  DISCHARGE INSTRUCTIONS  1) The drugs that you were given will stay in your system until tomorrow so for the next 24 hours you should not: A) Drive an automobile B) Make any legal decisions C) Drink any alcoholic beverage  2) You may resume regular meals tomorrow.  Today it is better to start with liquids and gradually work up to solid foods. You may eat anything you prefer, but it is better to start with liquids, then soup and crackers, and gradually work up to solid foods.  3) Please notify your doctor immediately if you have any unusual bleeding, trouble breathing, redness and pain at the surgery site, drainage, fever, or pain not relieved by medication.  4) Additional Instructions:   Please contact your physician with any problems or Same  Day Surgery at [phone], Monday through Friday 6 am to 4 pm, or [facility] number at [phone]. 

## 2020-04-10 NOTE — TOC Progression Note (Signed)
Transition of Care [facility]) - Progression Note    Patient Details  Name: [name] MRN: [id number] Date of Birth: [birth year]  Transition of Care [facility]) CM/SW [place], [place] Phone Number: [phone] 04/10/2020, 10:41 AM  Clinical Narrative:      CSW received request for front wheel walker, sent request for delivery  to [employer], who confirmed receipt of request..       Expected Discharge Plan and Services           Expected Discharge Date: 04/10/20                                     Social Determinants of Health (SDOH) Interventions    Readmission Risk Interventions No flowsheet data found.

## 2020-04-10 NOTE — H&P (Signed)
HISTORY AND PHYSICAL INTERVAL NOTE:  04/10/2020  7:28 AM  [name]  has presented today for surgery, with the diagnosis of M20.11 HALLUX VALGUS RIGHT.  The various methods of treatment have been discussed with the patient.  No guarantees were given.  After consideration of risks, benefits and other options for treatment, the patient has consented to surgery.  I have reviewed the patients' chart and labs.     A history and physical examination was performed in my office.  The patient was reexamined.  There have been no changes to this history and physical examination.  [name] A

## 2020-04-10 NOTE — Op Note (Signed)
Operative note   Surgeon:Mataeo Ingwersen Lawyer: None    Preop diagnosis: Hallux valgus deformity right foot    Postop diagnosis: Same    Procedure: Austin with Akin hallux valgus deformity correction right foot    EBL: Minimal    Anesthesia:local and general    Hemostasis: Ankle tourniquet inflated to 200 mmHg for 60 minutes    Specimen: None    Complications: None    Operative indications:[name] is an 70 y.o. that presents today for surgical intervention.  The risks/benefits/alternatives/complications have been discussed and consent has been given.    Procedure:  Patient was brought into the OR and placed on the operating table in thesupine position. After anesthesia was obtained theright lower extremity was prepped and draped in usual sterile fashion.  Attention was directed to the dorsomedial right first MTPJ where an incision was performed.  Sharp and blunt dissection carried down to the capsule.  A T capsulotomy was then performed.  The medial eminence was noted and transected.  The intermetatarsal space was entered and the DTI L was transected.  The conjoined tendon of the abductor was then excised.  Next a V osteotomy was created.  The capital fragment was translocated laterally.  This was stabilized with a 0.062 K wire from proximal to distal and supplemented with a 2.5 mm compression screw from dorsal distal to proximal plantar.  Good stability and alignment was noted.  Mild residual valgus of the great toe was noted.  The ensuing overhanging ledge was transected with a power saw and smoothed with a power rasp.  At this time attention was directed to the midshaft of the proximal phalanx where a small V osteotomy was created with the apex lateral.  The small wedge of bone was removed.  This was then compressed and stabilized with a compression staple.  Good realignment of the first MTPJ and great toe was noted at this time.  The wound was flushed with copious amounts  of irrigation.  A small capsulorrhaphy was performed medially.  Closure was performed of the capsule with a 3-0 Vicryl, 4-0 Vicryl for the subcutaneous tissue and a 4-0 Monocryl for skin.  A bulky sterile dressing was applied and patient was then placed in an equalizer walker boot.  The area was supplemented with Exparel long-acting anesthetic    Patient tolerated the procedure and anesthesia well.  Was transported from the OR to the PACU with all vital signs stable and vascular status intact. To be discharged per routine protocol.  Will follow up in approximately 1 week in the outpatient clinic.

## 2020-04-10 NOTE — Transfer of Care (Signed)
Immediate Anesthesia Transfer of Care Note  Patient: [name]  Procedure(s) Performed: HALLUX VALGUS [place]IN RIGHT (Right Foot) HALLUX VALGUS AKIN PHALANX OSTEOTOMY RIGHT (Right Foot)  Patient Location: PACU  Anesthesia Type:General  Level of Consciousness: sedated  Airway & Oxygen Therapy: Patient Spontanous Breathing and Patient connected to face mask oxygen  Post-op Assessment: Report given to RN and Post -op Vital signs reviewed and stable  Post vital signs: Reviewed and stable  Last Vitals:  Vitals Value Taken Time  BP 122/63 04/10/20 0910  Temp    Pulse 74 04/10/20 0912  Resp 18 04/10/20 0912  SpO2 100 % 04/10/20 0912  Vitals shown include unvalidated device data.  Last Pain:  Vitals:   04/10/20 0615  TempSrc: Oral  PainSc: 0-No pain         Complications: No complications documented.

## 2020-04-15 NOTE — Anesthesia Postprocedure Evaluation (Signed)
Anesthesia Post Note  Patient: [name]  Procedure(s) Performed: HALLUX VALGUS [place]USTIN RIGHT (Right Foot) HALLUX VALGUS AKIN PHALANX OSTEOTOMY RIGHT (Right Foot)  Patient location during evaluation: PACU Anesthesia Type: General Level of consciousness: awake and alert and oriented Pain management: pain level controlled Vital Signs Assessment: post-procedure vital signs reviewed and stable Respiratory status: spontaneous breathing Cardiovascular status: blood pressure returned to baseline Anesthetic complications: no   No complications documented.   Last Vitals:  Vitals:   04/10/20 1010 04/10/20 1024  BP: 131/69 (!) 143/72  Pulse: 62 66  Resp: 13 14  Temp: (!) 36.3 C (!) 36.4 C  SpO2: 94% 97%    Last Pain:  Vitals:   04/11/20 1429  TempSrc:   PainSc: 0-No pain                 Quetzal Meany

## 2020-04-16 DIAGNOSIS — M2011 Hallux valgus (acquired), right foot: Secondary | ICD-10-CM | POA: Diagnosis not present

## 2020-05-14 DIAGNOSIS — M2011 Hallux valgus (acquired), right foot: Secondary | ICD-10-CM | POA: Diagnosis not present

## 2020-07-09 DIAGNOSIS — M2011 Hallux valgus (acquired), right foot: Secondary | ICD-10-CM | POA: Diagnosis not present

## 2020-08-03 DIAGNOSIS — I129 Hypertensive chronic kidney disease with stage 1 through stage 4 chronic kidney disease, or unspecified chronic kidney disease: Secondary | ICD-10-CM | POA: Diagnosis not present

## 2020-08-03 DIAGNOSIS — I779 Disorder of arteries and arterioles, unspecified: Secondary | ICD-10-CM | POA: Diagnosis not present

## 2020-08-03 DIAGNOSIS — N183 Chronic kidney disease, stage 3 unspecified: Secondary | ICD-10-CM | POA: Diagnosis not present

## 2020-08-10 DIAGNOSIS — M7989 Other specified soft tissue disorders: Secondary | ICD-10-CM | POA: Diagnosis not present

## 2020-08-10 DIAGNOSIS — I129 Hypertensive chronic kidney disease with stage 1 through stage 4 chronic kidney disease, or unspecified chronic kidney disease: Secondary | ICD-10-CM | POA: Diagnosis not present

## 2020-08-10 DIAGNOSIS — R739 Hyperglycemia, unspecified: Secondary | ICD-10-CM | POA: Diagnosis not present

## 2020-08-10 DIAGNOSIS — I779 Disorder of arteries and arterioles, unspecified: Secondary | ICD-10-CM | POA: Diagnosis not present

## 2020-08-10 DIAGNOSIS — F325 Major depressive disorder, single episode, in full remission: Secondary | ICD-10-CM | POA: Diagnosis not present

## 2020-08-10 DIAGNOSIS — N183 Chronic kidney disease, stage 3 unspecified: Secondary | ICD-10-CM | POA: Diagnosis not present

## 2020-08-10 DIAGNOSIS — Z0000 Encounter for general adult medical examination without abnormal findings: Secondary | ICD-10-CM | POA: Diagnosis not present

## 2020-08-10 DIAGNOSIS — Z23 Encounter for immunization: Secondary | ICD-10-CM | POA: Diagnosis not present

## 2020-08-10 IMAGING — CT CT HEAD W/O CM
3 series · 16 of 47 positions shown, 19 images · non-contrast
Comparison: None.

CLINICAL DATA: Syncope, head injury after fall yesterday.

EXAM:
CT HEAD WITHOUT CONTRAST
TECHNIQUE: Contiguous axial images were obtained from the base of the skull
through the vertex without intravenous contrast.

[Series 2: head wo · axial · 0.42mm/px · z∈[-102,+28]mm · 10 of 32 slices shown, 13 images]
[im 3/32  brain]
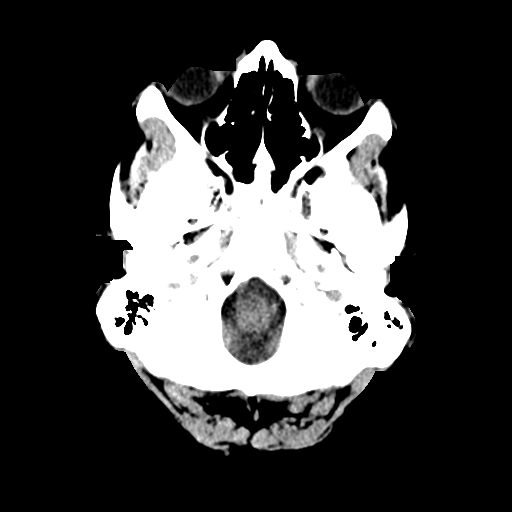
[im 3/32  bone]
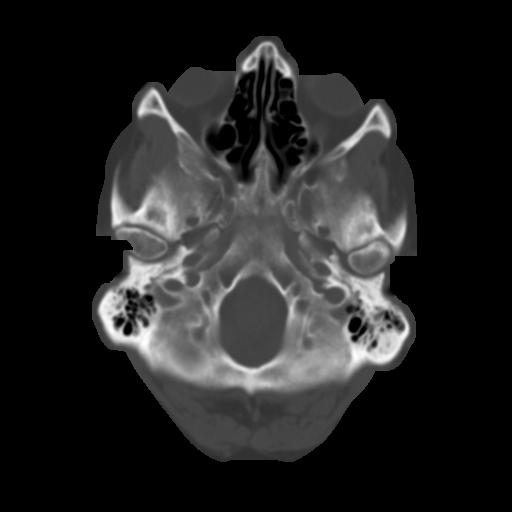
[im 6/32  brain]
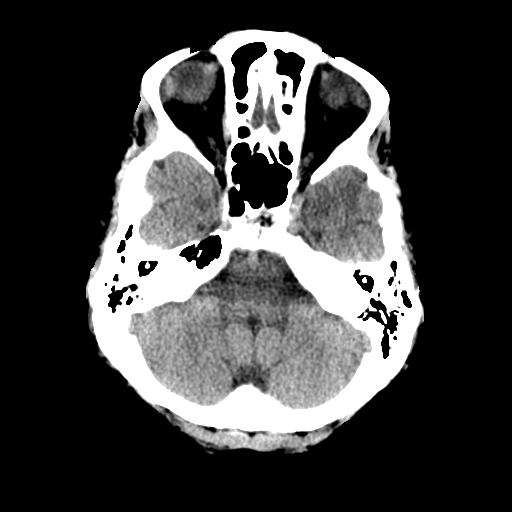
[im 9/32  brain]
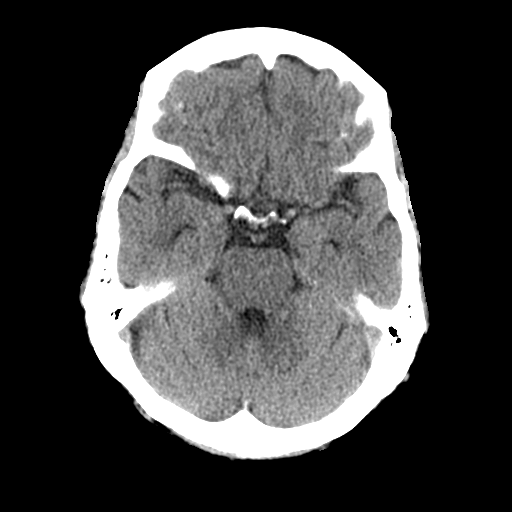
[im 11/32  brain]
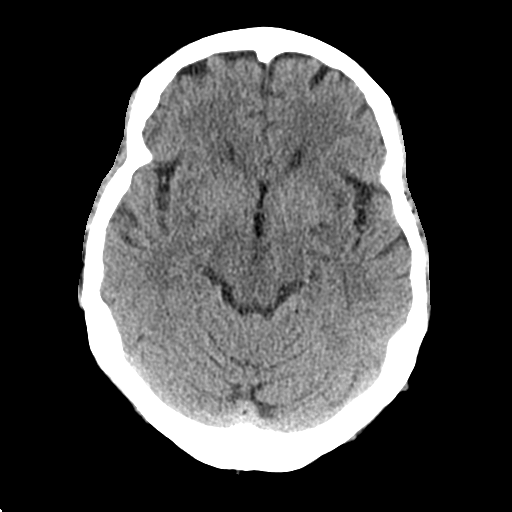
[im 14/32  brain]
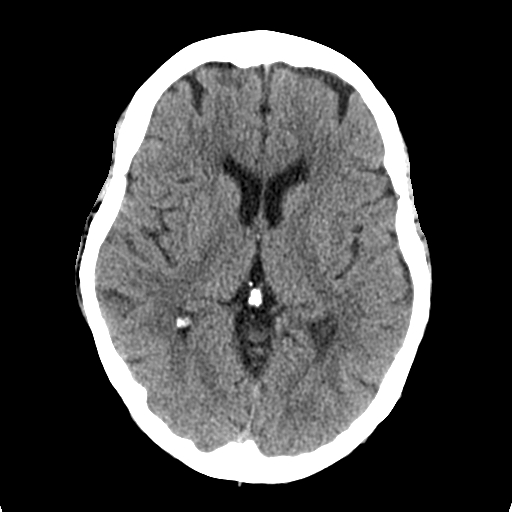
[im 14/32  bone]
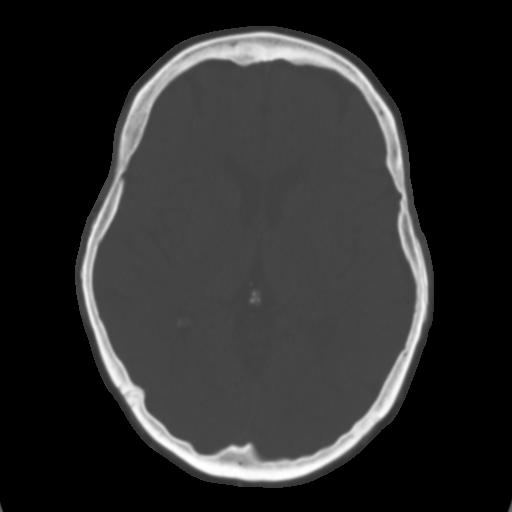
[im 18/32  brain]
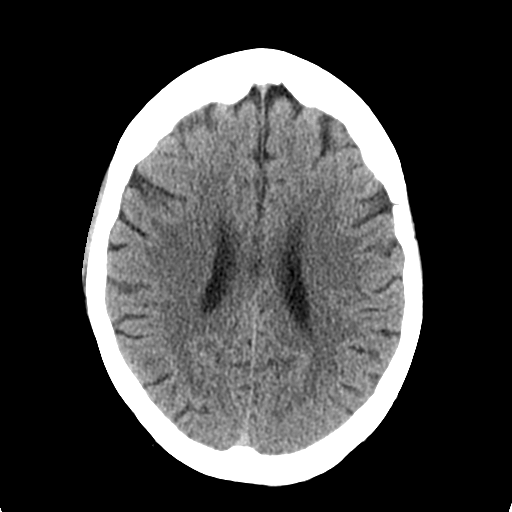
[im 21/32  brain]
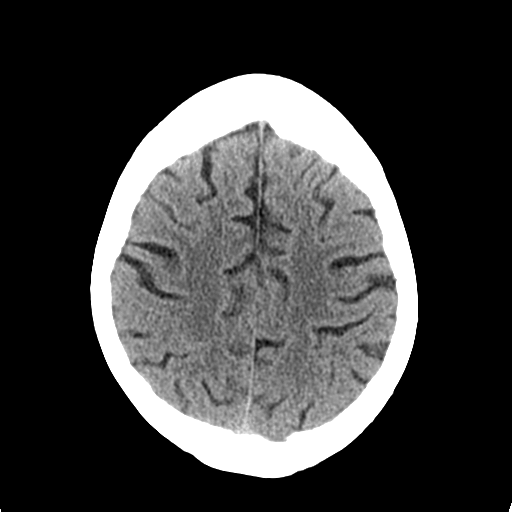
[im 24/32  brain]
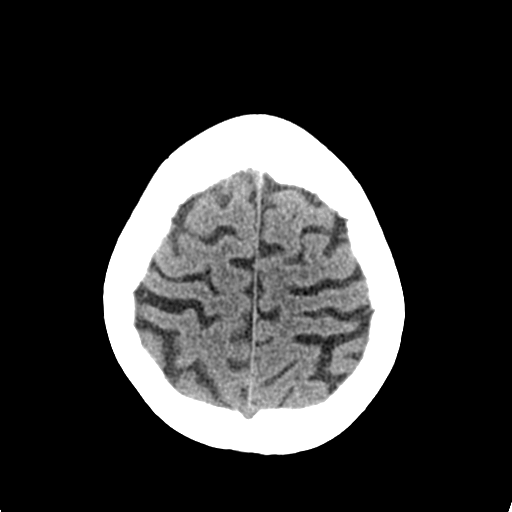
[im 26/32  brain]
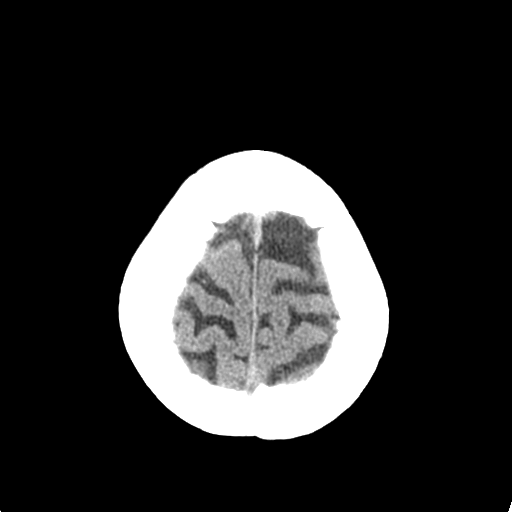
[im 26/32  bone]
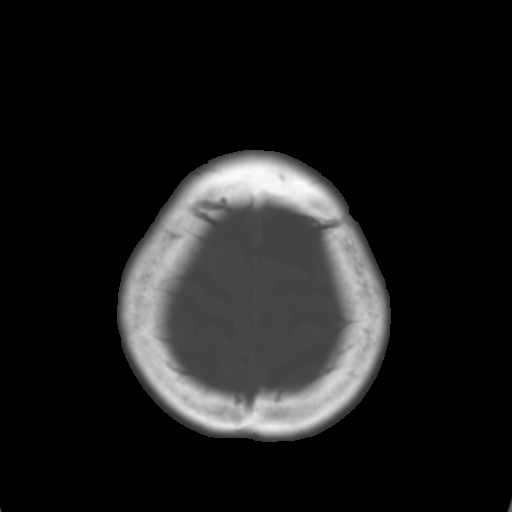
[im 29/32  brain]
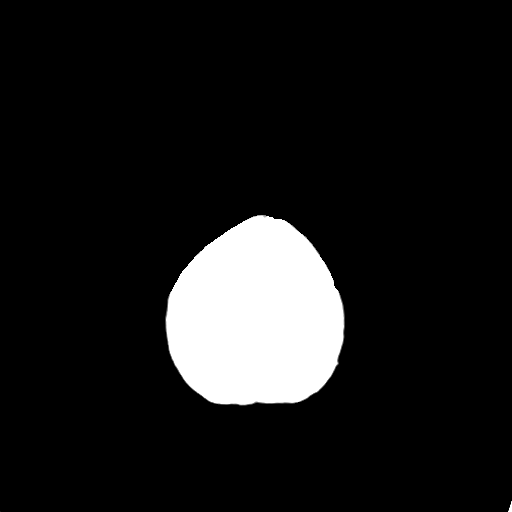

[Series 3: coronal soft tissue · coronal · 0.32mm/px · 3 of 67 slices shown]
[im 23/67  brain]
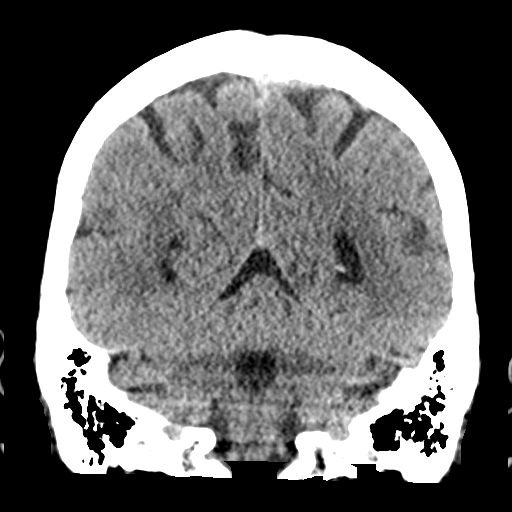
[im 30/67  brain]
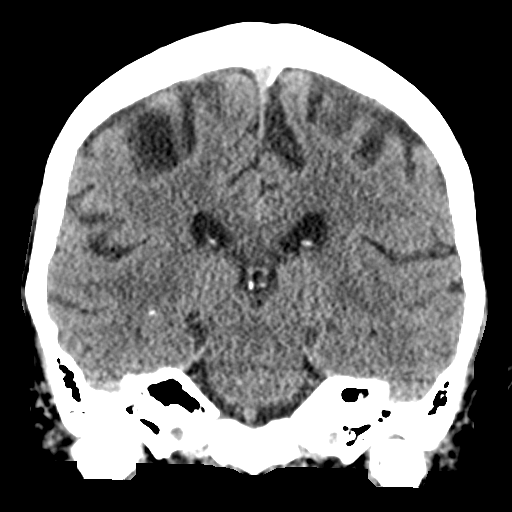
[im 37/67  brain]
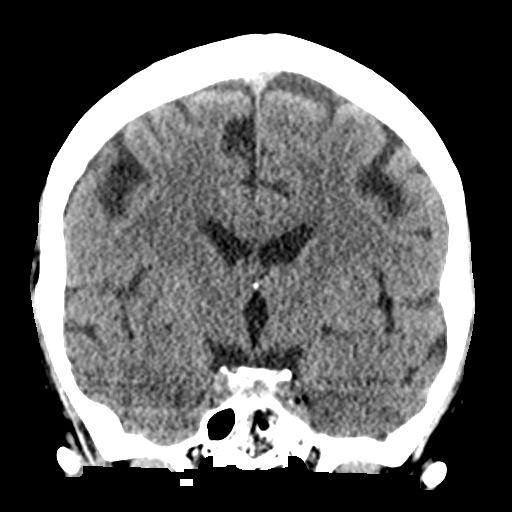

[Series 4: sagittal soft tissue · sagittal · 0.31mm/px · 3 of 59 slices shown]
[im 20/59  brain]
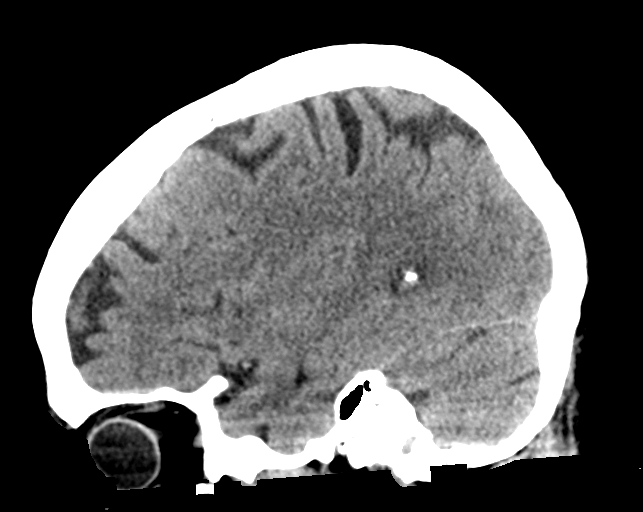
[im 30/59  brain]
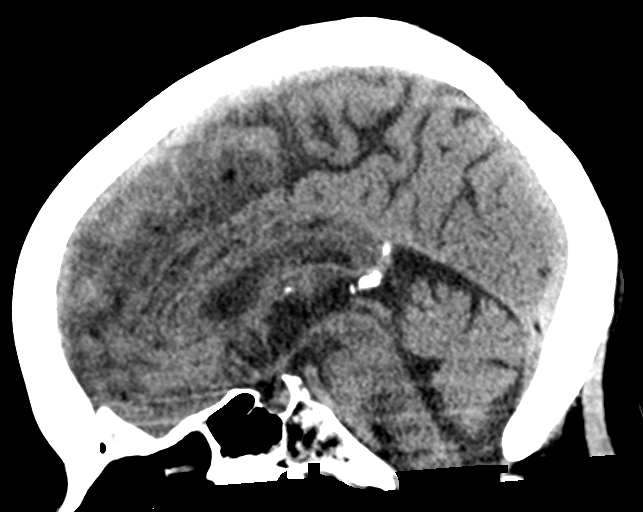
[im 39/59  brain]
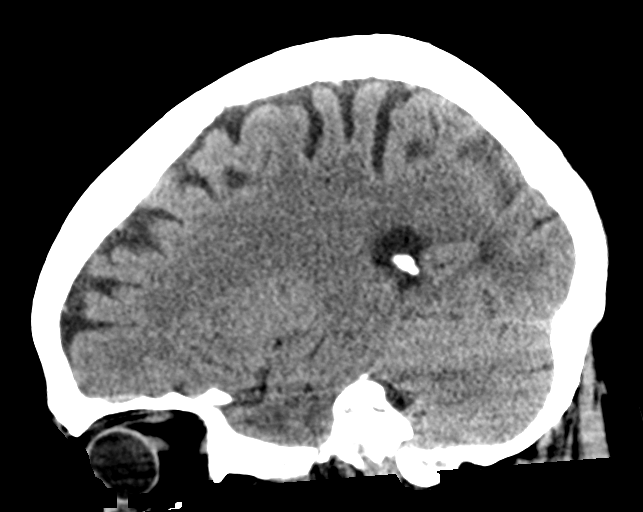

[16 of 47 positions shown; findings below may reference images not displayed]

FINDINGS: Brain: No evidence of acute infarction, hemorrhage, hydrocephalus,
extra-axial collection or mass lesion/mass effect.

Vascular: No hyperdense vessel or unexpected calcification.

Skull: Normal. Negative for fracture or focal lesion.

Sinuses/Orbits: No acute finding.

Other: None.
IMPRESSION: Normal head CT.

## 2020-09-29 ENCOUNTER — Other Ambulatory Visit: Payer: Self-pay | Admitting: General Surgery

## 2020-09-29 DIAGNOSIS — D1722 Benign lipomatous neoplasm of skin and subcutaneous tissue of left arm: Secondary | ICD-10-CM | POA: Diagnosis not present

## 2020-10-01 LAB — SURGICAL PATHOLOGY

## 2021-02-16 DIAGNOSIS — M2011 Hallux valgus (acquired), right foot: Secondary | ICD-10-CM | POA: Diagnosis not present

## 2021-02-16 DIAGNOSIS — E118 Type 2 diabetes mellitus with unspecified complications: Secondary | ICD-10-CM | POA: Diagnosis not present

## 2021-02-19 DIAGNOSIS — T8484XA Pain due to internal orthopedic prosthetic devices, implants and grafts, initial encounter: Secondary | ICD-10-CM | POA: Diagnosis not present

## 2021-02-23 DIAGNOSIS — E118 Type 2 diabetes mellitus with unspecified complications: Secondary | ICD-10-CM | POA: Diagnosis not present

## 2021-02-23 DIAGNOSIS — E7800 Pure hypercholesterolemia, unspecified: Secondary | ICD-10-CM | POA: Diagnosis not present

## 2021-02-23 DIAGNOSIS — I129 Hypertensive chronic kidney disease with stage 1 through stage 4 chronic kidney disease, or unspecified chronic kidney disease: Secondary | ICD-10-CM | POA: Diagnosis not present

## 2021-02-23 DIAGNOSIS — N183 Chronic kidney disease, stage 3 unspecified: Secondary | ICD-10-CM | POA: Diagnosis not present

## 2021-02-23 DIAGNOSIS — F325 Major depressive disorder, single episode, in full remission: Secondary | ICD-10-CM | POA: Diagnosis not present

## 2021-02-23 DIAGNOSIS — I779 Disorder of arteries and arterioles, unspecified: Secondary | ICD-10-CM | POA: Diagnosis not present

## 2021-07-27 DIAGNOSIS — Z9841 Cataract extraction status, right eye: Secondary | ICD-10-CM | POA: Diagnosis not present

## 2021-07-27 DIAGNOSIS — Z9842 Cataract extraction status, left eye: Secondary | ICD-10-CM | POA: Diagnosis not present

## 2021-07-27 DIAGNOSIS — H52223 Regular astigmatism, bilateral: Secondary | ICD-10-CM | POA: Diagnosis not present

## 2022-01-26 DIAGNOSIS — F325 Major depressive disorder, single episode, in full remission: Secondary | ICD-10-CM | POA: Diagnosis not present

## 2022-01-26 DIAGNOSIS — J189 Pneumonia, unspecified organism: Secondary | ICD-10-CM | POA: Diagnosis not present

## 2022-01-26 DIAGNOSIS — N183 Chronic kidney disease, stage 3 unspecified: Secondary | ICD-10-CM | POA: Diagnosis not present

## 2022-01-26 DIAGNOSIS — I129 Hypertensive chronic kidney disease with stage 1 through stage 4 chronic kidney disease, or unspecified chronic kidney disease: Secondary | ICD-10-CM | POA: Diagnosis not present

## 2022-01-26 DIAGNOSIS — Z0000 Encounter for general adult medical examination without abnormal findings: Secondary | ICD-10-CM | POA: Diagnosis not present

## 2022-01-26 DIAGNOSIS — I779 Disorder of arteries and arterioles, unspecified: Secondary | ICD-10-CM | POA: Diagnosis not present

## 2022-02-09 DIAGNOSIS — J189 Pneumonia, unspecified organism: Secondary | ICD-10-CM | POA: Diagnosis not present

## 2022-10-04 DIAGNOSIS — Z9841 Cataract extraction status, right eye: Secondary | ICD-10-CM | POA: Diagnosis not present

## 2022-10-04 DIAGNOSIS — H52223 Regular astigmatism, bilateral: Secondary | ICD-10-CM | POA: Diagnosis not present

## 2022-10-04 DIAGNOSIS — Z9842 Cataract extraction status, left eye: Secondary | ICD-10-CM | POA: Diagnosis not present

## 2023-08-18 DIAGNOSIS — H43811 Vitreous degeneration, right eye: Secondary | ICD-10-CM | POA: Diagnosis not present
